# Patient Record
Sex: Female | Born: 1975 | Race: Black or African American | Hispanic: No | Marital: Married | Smoking: Never smoker
Health system: Southern US, Community
[De-identification: ages and names within clinical notes are randomized; demographics above are authoritative.]

## PROBLEM LIST (undated history)

## (undated) ENCOUNTER — Inpatient Hospital Stay (HOSPITAL_COMMUNITY): Payer: Self-pay

## (undated) DIAGNOSIS — Z789 Other specified health status: Secondary | ICD-10-CM

## (undated) DIAGNOSIS — D573 Sickle-cell trait: Secondary | ICD-10-CM

## (undated) DIAGNOSIS — O09529 Supervision of elderly multigravida, unspecified trimester: Secondary | ICD-10-CM

## (undated) HISTORY — DX: Sickle-cell trait: D57.3

## (undated) HISTORY — PX: NO PAST SURGERIES: SHX2092

## (undated) HISTORY — DX: Supervision of elderly multigravida, unspecified trimester: O09.529

---

## 2009-12-24 ENCOUNTER — Ambulatory Visit (HOSPITAL_COMMUNITY): Admission: RE | Admit: 2009-12-24 | Discharge: 2009-12-24 | Payer: Self-pay | Admitting: Obstetrics & Gynecology

## 2010-01-06 ENCOUNTER — Ambulatory Visit (HOSPITAL_COMMUNITY): Admission: RE | Admit: 2010-01-06 | Discharge: 2010-01-06 | Payer: Self-pay | Admitting: Obstetrics & Gynecology

## 2010-01-28 ENCOUNTER — Ambulatory Visit (HOSPITAL_COMMUNITY): Admission: RE | Admit: 2010-01-28 | Discharge: 2010-01-28 | Payer: Self-pay | Admitting: Obstetrics & Gynecology

## 2010-03-04 ENCOUNTER — Ambulatory Visit (HOSPITAL_COMMUNITY)
Admission: RE | Admit: 2010-03-04 | Discharge: 2010-03-04 | Payer: Self-pay | Source: Home / Self Care | Admitting: Obstetrics & Gynecology

## 2010-04-22 ENCOUNTER — Ambulatory Visit (HOSPITAL_COMMUNITY)
Admission: RE | Admit: 2010-04-22 | Discharge: 2010-04-22 | Payer: Self-pay | Source: Home / Self Care | Attending: Obstetrics and Gynecology | Admitting: Obstetrics and Gynecology

## 2010-05-02 ENCOUNTER — Other Ambulatory Visit (HOSPITAL_COMMUNITY): Payer: Self-pay | Admitting: Obstetrics and Gynecology

## 2010-05-02 DIAGNOSIS — O2690 Pregnancy related conditions, unspecified, unspecified trimester: Secondary | ICD-10-CM

## 2010-05-22 ENCOUNTER — Ambulatory Visit (HOSPITAL_COMMUNITY): Payer: Self-pay

## 2010-05-26 ENCOUNTER — Other Ambulatory Visit (HOSPITAL_COMMUNITY): Payer: Self-pay | Admitting: Obstetrics and Gynecology

## 2010-05-26 ENCOUNTER — Ambulatory Visit (HOSPITAL_COMMUNITY)
Admission: RE | Admit: 2010-05-26 | Discharge: 2010-05-26 | Disposition: A | Discharge status: Home / Self Care | Payer: BC Managed Care – PPO | Source: Ambulatory Visit | Attending: Obstetrics and Gynecology | Admitting: Obstetrics and Gynecology

## 2010-05-26 ENCOUNTER — Encounter (HOSPITAL_COMMUNITY): Payer: Self-pay

## 2010-05-26 DIAGNOSIS — E669 Obesity, unspecified: Secondary | ICD-10-CM | POA: Insufficient documentation

## 2010-05-26 DIAGNOSIS — O3510X0 Maternal care for (suspected) chromosomal abnormality in fetus, unspecified, not applicable or unspecified: Secondary | ICD-10-CM | POA: Insufficient documentation

## 2010-05-26 DIAGNOSIS — O2690 Pregnancy related conditions, unspecified, unspecified trimester: Secondary | ICD-10-CM

## 2010-05-26 DIAGNOSIS — O351XX0 Maternal care for (suspected) chromosomal abnormality in fetus, not applicable or unspecified: Secondary | ICD-10-CM | POA: Insufficient documentation

## 2010-05-26 DIAGNOSIS — O99210 Obesity complicating pregnancy, unspecified trimester: Secondary | ICD-10-CM | POA: Insufficient documentation

## 2010-05-26 DIAGNOSIS — O09299 Supervision of pregnancy with other poor reproductive or obstetric history, unspecified trimester: Secondary | ICD-10-CM | POA: Insufficient documentation

## 2010-06-12 ENCOUNTER — Observation Stay (HOSPITAL_COMMUNITY)
Admission: RE | Admit: 2010-06-12 | Discharge: 2010-06-12 | Disposition: A | Discharge status: Home / Self Care | Payer: BC Managed Care – PPO | Source: Ambulatory Visit | Attending: Obstetrics & Gynecology | Admitting: Obstetrics & Gynecology

## 2010-06-12 DIAGNOSIS — O99891 Other specified diseases and conditions complicating pregnancy: Principal | ICD-10-CM | POA: Insufficient documentation

## 2010-07-01 ENCOUNTER — Inpatient Hospital Stay (HOSPITAL_COMMUNITY)
Admission: AD | Admit: 2010-07-01 | Discharge: 2010-07-03 | DRG: 373 | Disposition: A | Discharge status: Home / Self Care | Payer: BC Managed Care – PPO | Source: Ambulatory Visit | Attending: Obstetrics & Gynecology | Admitting: Obstetrics & Gynecology

## 2010-07-01 ENCOUNTER — Inpatient Hospital Stay (HOSPITAL_COMMUNITY): Admission: AD | Admit: 2010-07-01 | Payer: Self-pay | Source: Home / Self Care | Admitting: Obstetrics & Gynecology

## 2010-07-01 DIAGNOSIS — O09299 Supervision of pregnancy with other poor reproductive or obstetric history, unspecified trimester: Secondary | ICD-10-CM

## 2010-07-01 DIAGNOSIS — O09529 Supervision of elderly multigravida, unspecified trimester: Secondary | ICD-10-CM | POA: Diagnosis present

## 2010-07-01 DIAGNOSIS — E669 Obesity, unspecified: Secondary | ICD-10-CM | POA: Diagnosis present

## 2010-07-01 LAB — CBC
HCT: 33.2 % — ABNORMAL LOW (ref 36.0–46.0)
Hemoglobin: 10.7 g/dL — ABNORMAL LOW (ref 12.0–15.0)
MCH: 28.2 pg (ref 26.0–34.0)
MCV: 87.6 fL (ref 78.0–100.0)
RBC: 3.79 MIL/uL — ABNORMAL LOW (ref 3.87–5.11)

## 2010-07-02 ENCOUNTER — Other Ambulatory Visit: Payer: Self-pay | Admitting: Obstetrics & Gynecology

## 2010-07-02 LAB — CBC
HCT: 31.1 % — ABNORMAL LOW (ref 36.0–46.0)
Hemoglobin: 10.1 g/dL — ABNORMAL LOW (ref 12.0–15.0)
MCH: 28.5 pg (ref 26.0–34.0)
MCHC: 32.5 g/dL (ref 30.0–36.0)

## 2010-08-03 ENCOUNTER — Other Ambulatory Visit: Payer: Self-pay | Admitting: Obstetrics & Gynecology

## 2011-01-26 ENCOUNTER — Encounter (HOSPITAL_COMMUNITY): Payer: Self-pay | Admitting: *Deleted

## 2013-04-27 ENCOUNTER — Other Ambulatory Visit: Payer: Self-pay | Admitting: Obstetrics and Gynecology

## 2013-04-27 LAB — OB RESULTS CONSOLE ABO/RH: RH Type: POSITIVE

## 2013-04-27 LAB — OB RESULTS CONSOLE HEPATITIS B SURFACE ANTIGEN: Hepatitis B Surface Ag: NEGATIVE

## 2013-04-27 LAB — OB RESULTS CONSOLE HIV ANTIBODY (ROUTINE TESTING): HIV: NONREACTIVE

## 2013-04-27 LAB — OB RESULTS CONSOLE RUBELLA ANTIBODY, IGM: Rubella: IMMUNE

## 2013-04-27 LAB — OB RESULTS CONSOLE ANTIBODY SCREEN: Antibody Screen: NEGATIVE

## 2013-04-27 LAB — OB RESULTS CONSOLE RPR: RPR: NONREACTIVE

## 2013-05-09 ENCOUNTER — Ambulatory Visit (HOSPITAL_COMMUNITY): Payer: BC Managed Care – PPO

## 2013-10-17 ENCOUNTER — Encounter (HOSPITAL_COMMUNITY): Payer: Self-pay

## 2013-10-17 ENCOUNTER — Inpatient Hospital Stay (HOSPITAL_COMMUNITY)
Admission: AD | Admit: 2013-10-17 | Discharge: 2013-10-17 | Disposition: A | Discharge status: Home / Self Care | Payer: BC Managed Care – PPO | Source: Ambulatory Visit | Attending: Obstetrics and Gynecology | Admitting: Obstetrics and Gynecology

## 2013-10-17 DIAGNOSIS — Z3689 Encounter for other specified antenatal screening: Secondary | ICD-10-CM

## 2013-10-17 DIAGNOSIS — O3090 Multiple gestation, unspecified, unspecified trimester: Secondary | ICD-10-CM

## 2013-10-17 DIAGNOSIS — O368131 Decreased fetal movements, third trimester, fetus 1: Secondary | ICD-10-CM

## 2013-10-17 DIAGNOSIS — O368190 Decreased fetal movements, unspecified trimester, not applicable or unspecified: Secondary | ICD-10-CM | POA: Insufficient documentation

## 2013-10-17 HISTORY — DX: Other specified health status: Z78.9

## 2013-10-17 NOTE — Discharge Instructions (Signed)
Fetal Movement Counts Patient Name: __________________________________________________ Patient Due Date: ____________________ Performing a fetal movement count is highly recommended in high-risk pregnancies, but it is good for every pregnant woman to do. Your caregiver may ask you to start counting fetal movements at 28 weeks of the pregnancy. Fetal movements often increase:  After eating a full meal.  After physical activity.  After eating or drinking something sweet or cold.  At rest. Pay attention to when you feel the baby is most active. This will help you notice a pattern of your baby's sleep and wake cycles and what factors contribute to an increase in fetal movement. It is important to perform a fetal movement count at the same time each day when your baby is normally most active.  HOW TO COUNT FETAL MOVEMENTS 1. Find a quiet and comfortable area to sit or lie down on your left side. Lying on your left side provides the best blood and oxygen circulation to your baby. 2. Write down the day and time on a sheet of paper or in a journal. 3. Start counting kicks, flutters, swishes, rolls, or jabs in a 2 hour period. You should feel at least 10 movements within 2 hours. 4. If you do not feel 10 movements in 2 hours, wait 2-3 hours and count again. Look for a change in the pattern or not enough counts in 2 hours. SEEK MEDICAL CARE IF:  You feel less than 10 counts in 2 hours, tried twice.  There is no movement in over an hour.  The pattern is changing or taking longer each day to reach 10 counts in 2 hours.  You feel the baby is not moving as he or she usually does. Date: ____________ Movements: ____________ Start time: ____________ Kathy Daugherty time: ____________  Date: ____________ Movements: ____________ Start time: ____________ Kathy Daugherty time: ____________ Date: ____________ Movements: ____________ Start time: ____________ Kathy Daugherty time: ____________ Date: ____________ Movements: ____________  Start time: ____________ Kathy Daugherty time: ____________ Date: ____________ Movements: ____________ Start time: ____________ Kathy Daugherty time: ____________ Date: ____________ Movements: ____________ Start time: ____________ Kathy Daugherty time: ____________ Date: ____________ Movements: ____________ Start time: ____________ Kathy Daugherty time: ____________ Date: ____________ Movements: ____________ Start time: ____________ Kathy Daugherty time: ____________  Date: ____________ Movements: ____________ Start time: ____________ Kathy Daugherty time: ____________ Date: ____________ Movements: ____________ Start time: ____________ Kathy Daugherty time: ____________ Date: ____________ Movements: ____________ Start time: ____________ Kathy Daugherty time: ____________ Date: ____________ Movements: ____________ Start time: ____________ Kathy Daugherty time: ____________ Date: ____________ Movements: ____________ Start time: ____________ Kathy Daugherty time: ____________ Date: ____________ Movements: ____________ Start time: ____________ Kathy Daugherty time: ____________ Date: ____________ Movements: ____________ Start time: ____________ Kathy Daugherty time: ____________  Date: ____________ Movements: ____________ Start time: ____________ Kathy Daugherty time: ____________ Document Released: 04/28/2006 Document Revised: 03/15/2012 Document Reviewed: 01/24/2012 ExitCare Patient Information 2015 [employer]. This information is not intended to replace advice given to you by your health care provider. Make sure you discuss any questions you have with your health care provider.

## 2013-10-17 NOTE — MAU Provider Note (Signed)
Chief Complaint:  Decreased Fetal Movement   First Provider Initiated Contact with Patient 10/17/13 [phone]      HPI: Kathy Daugherty is a 38 y.o. [id number] at 34w3dwho presents to maternity admissions reporting she has felt decreased fetal movement since 10 pm.  She is often up at night and usually feels lots of baby movement but was only feeling 1-2 movements/hour for the last few hours. She has hx of stillbirth at term in 2003 with baby dx with Down Syndrome with normal subsequent pregnancy and delivery in 2012 of her son.  She is feeling good fetal movement in the MAU, denies regular contractions, LOF, vaginal bleeding, vaginal itching/burning, urinary symptoms, h/a, dizziness, n/v, or fever/chills.     Past Medical History: Past Medical History  Diagnosis Date  . Medical history non-contributory     Past obstetric history: OB History  Gravida Para Term Preterm AB SAB TAB Ectopic Multiple Living  5 3 3  1 1    2     # Outcome Date GA Lbr Len/2nd Weight Sex Delivery Anes PTL Lv  5 CUR           4 TRM 07/01/10 465w0d3.629 kg (8 lb) M SVD EPI  Y  3 TRM 07/25/01   2.523 kg (5 lb 9 oz) F SVD None  SB     Comments: downs syndrome  2 TRM 02/17/95   3.544 kg (7 lb 13 oz) M SVD EPI  Y  1 SAB 1993              Past Surgical History: Past Surgical History  Procedure Laterality Date  . No past surgeries      Family History: No family history on file.  Social History: History  Substance Use Topics  . Smoking status: Never Smoker   . Smokeless tobacco: Never Used  . Alcohol Use: No    Allergies: No Known Allergies  Meds:  Prescriptions prior to admission  Medication Sig Dispense Refill  . Prenatal Vit-Fe Fumarate-FA (PRENATAL MULTIVITAMIN) TABS tablet Take 1 tablet by mouth daily at 12 noon.        ROS: Pertinent findings in history of present illness.  Physical Exam  Blood pressure 113/69, pulse 84, temperature 98.9 F (37.2 C), temperature source Oral, resp. rate 18, height  5' 7.5" (1.715 m), weight 138.347 kg (305 lb), not currently breastfeeding. GENERAL: Well-developed, well-nourished female in no acute distress.  HEENT: normocephalic HEART: normal rate RESP: normal effort ABDOMEN: Soft, non-tender, gravid appropriate for gestational age EXTREMITIES: Nontender, no edema NEURO: alert and oriented     FHT:  Baseline 140, moderate variability, accelerations present, no decelerations Contractions: None on toco or to palpatoin    Assessment: 1. Decreased fetal movement, third trimester, fetus 1   2. NST (non-stress test) reactive     Plan: Consult Dr AnOuida Sillsischarge home Reviewed reactive NST with pt, reassurance provided Labor precautions and fetal kick counts  Follow-up Information   Follow up with [facility](As scheduled)    Contact information:   [address] [id number] [phone]    Follow up with [facility](As needed for emergencies)    Contact information:   [phone]     Medication List         prenatal multivitamin Tabs tablet  Take 1 tablet by mouth  daily at 12 noon.        Fatima Blank Certified Nurse-Midwife 10/17/2013 3:47 AM

## 2013-10-17 NOTE — MAU Note (Signed)
Decreased movement since 10 pm. States doesn't feel baby move unless she messes with her stomach, then only feels 1 or 2 movements. Denies LOF/vaginal bleeding/abdominal trauma/contractions.

## 2013-10-19 LAB — OB RESULTS CONSOLE GBS: STREP GROUP B AG: NEGATIVE

## 2013-11-02 ENCOUNTER — Telehealth (HOSPITAL_COMMUNITY): Payer: Self-pay | Admitting: *Deleted

## 2013-11-02 ENCOUNTER — Encounter (HOSPITAL_COMMUNITY): Payer: Self-pay | Admitting: *Deleted

## 2013-11-02 NOTE — Telephone Encounter (Signed)
Preadmission screen  

## 2013-11-05 ENCOUNTER — Inpatient Hospital Stay (HOSPITAL_COMMUNITY)
Admission: RE | Admit: 2013-11-05 | Discharge: 2013-11-05 | DRG: 782 | Disposition: A | Discharge status: Home / Self Care | Payer: BC Managed Care – PPO | Source: Ambulatory Visit | Attending: Obstetrics and Gynecology | Admitting: Obstetrics and Gynecology

## 2013-11-05 DIAGNOSIS — Z5309 Procedure and treatment not carried out because of other contraindication: Secondary | ICD-10-CM

## 2013-11-05 DIAGNOSIS — O321XX0 Maternal care for breech presentation, not applicable or unspecified: Secondary | ICD-10-CM | POA: Diagnosis not present

## 2013-11-05 MED ORDER — TERBUTALINE SULFATE 1 MG/ML IJ SOLN
0.2500 mg | Freq: Once | INTRAMUSCULAR | Status: DC
  Filled 2013-11-05: qty 1

## 2013-11-05 MED ORDER — LACTATED RINGERS IV SOLN
INTRAVENOUS | Status: DC
  Administered 2013-11-05: 08:00:00 via INTRAVENOUS

## 2013-11-09 ENCOUNTER — Telehealth (HOSPITAL_COMMUNITY): Payer: Self-pay | Admitting: *Deleted

## 2013-11-09 NOTE — Telephone Encounter (Signed)
Preadmission screen  

## 2013-11-10 ENCOUNTER — Inpatient Hospital Stay (HOSPITAL_COMMUNITY)
Admission: RE | Admit: 2013-11-10 | Discharge: 2013-11-13 | DRG: 765 | Disposition: A | Discharge status: Home / Self Care | Payer: BC Managed Care – PPO | Source: Ambulatory Visit | Attending: Obstetrics and Gynecology | Admitting: Obstetrics and Gynecology

## 2013-11-10 ENCOUNTER — Encounter (HOSPITAL_COMMUNITY): Payer: Self-pay

## 2013-11-10 ENCOUNTER — Inpatient Hospital Stay (HOSPITAL_COMMUNITY): Payer: BC Managed Care – PPO

## 2013-11-10 DIAGNOSIS — Z9889 Other specified postprocedural states: Secondary | ICD-10-CM

## 2013-11-10 DIAGNOSIS — Z9851 Tubal ligation status: Secondary | ICD-10-CM

## 2013-11-10 DIAGNOSIS — O322XX0 Maternal care for transverse and oblique lie, not applicable or unspecified: Secondary | ICD-10-CM | POA: Diagnosis not present

## 2013-11-10 DIAGNOSIS — O99214 Obesity complicating childbirth: Secondary | ICD-10-CM

## 2013-11-10 DIAGNOSIS — O3090 Multiple gestation, unspecified, unspecified trimester: Secondary | ICD-10-CM | POA: Diagnosis not present

## 2013-11-10 DIAGNOSIS — O322XX1 Maternal care for transverse and oblique lie, fetus 1: Secondary | ICD-10-CM

## 2013-11-10 DIAGNOSIS — D573 Sickle-cell trait: Secondary | ICD-10-CM | POA: Diagnosis present

## 2013-11-10 DIAGNOSIS — E669 Obesity, unspecified: Secondary | ICD-10-CM | POA: Diagnosis present

## 2013-11-10 DIAGNOSIS — Z6841 Body Mass Index (BMI) 40.0 and over, adult: Secondary | ICD-10-CM

## 2013-11-10 DIAGNOSIS — O09529 Supervision of elderly multigravida, unspecified trimester: Secondary | ICD-10-CM | POA: Diagnosis present

## 2013-11-10 DIAGNOSIS — O9902 Anemia complicating childbirth: Secondary | ICD-10-CM | POA: Diagnosis present

## 2013-11-10 DIAGNOSIS — Z302 Encounter for sterilization: Secondary | ICD-10-CM

## 2013-11-10 LAB — CBC
HEMATOCRIT: 30.7 % — AB (ref 36.0–46.0)
HEMOGLOBIN: 10.2 g/dL — AB (ref 12.0–15.0)
MCH: 28.4 pg (ref 26.0–34.0)
MCHC: 33.2 g/dL (ref 30.0–36.0)
MCV: 85.5 fL (ref 78.0–100.0)
Platelets: 133 10*3/uL — ABNORMAL LOW (ref 150–400)
RBC: 3.59 MIL/uL — ABNORMAL LOW (ref 3.87–5.11)
RDW: 15.8 % — ABNORMAL HIGH (ref 11.5–15.5)
WBC: 8.5 10*3/uL (ref 4.0–10.5)

## 2013-11-10 LAB — TYPE AND SCREEN
ABO/RH(D): AB POS
Antibody Screen: NEGATIVE

## 2013-11-10 MED ORDER — OXYTOCIN BOLUS FROM INFUSION: 500.0000 mL | INTRAVENOUS | Status: DC

## 2013-11-10 MED ORDER — FLEET ENEMA 7-19 GM/118ML RE ENEM: 1.0000 | ENEMA | RECTAL | Status: DC | PRN

## 2013-11-10 MED ORDER — IBUPROFEN 600 MG PO TABS: 600.0000 mg | ORAL_TABLET | Freq: Four times a day (QID) | ORAL | Status: DC | PRN

## 2013-11-10 MED ORDER — LIDOCAINE HCL (PF) 1 % IJ SOLN: 30.0000 mL | INTRAMUSCULAR | Status: DC | PRN

## 2013-11-10 MED ORDER — ACETAMINOPHEN 325 MG PO TABS: 650.0000 mg | ORAL_TABLET | ORAL | Status: DC | PRN

## 2013-11-10 MED ORDER — OXYTOCIN 40 UNITS IN LACTATED RINGERS INFUSION - SIMPLE MED: 62.5000 mL/h | INTRAVENOUS | Status: DC

## 2013-11-10 MED ORDER — CITRIC ACID-SODIUM CITRATE 334-500 MG/5ML PO SOLN
30.0000 mL | ORAL | Status: DC | PRN
  Administered 2013-11-11: 30 mL via ORAL
  Filled 2013-11-10: qty 15

## 2013-11-10 MED ORDER — OXYCODONE-ACETAMINOPHEN 5-325 MG PO TABS: 1.0000 | ORAL_TABLET | ORAL | Status: DC | PRN

## 2013-11-10 MED ORDER — LACTATED RINGERS IV SOLN: 500.0000 mL | INTRAVENOUS | Status: DC | PRN

## 2013-11-10 MED ORDER — LACTATED RINGERS IV SOLN
INTRAVENOUS | Status: DC
  Administered 2013-11-10 – 2013-11-11 (×3): via INTRAVENOUS

## 2013-11-10 NOTE — Progress Notes (Signed)
Called Dr.  Philis Pique-  Notified of FHR, Uc's too close for  Cytotec, recent Exam- 1/thick/hich- ? Presenting part.  MD request NP MAU verifiy presenting part with ultrasound and call back.

## 2013-11-10 NOTE — Progress Notes (Signed)
Notified Dr. Philis Pique of Bedside US- Transverse Lie. MD ordered Complete US- AFI, BPP, Presentation. Call MD with results.

## 2013-11-10 NOTE — Progress Notes (Signed)
Patient ID: Kathy Daugherty, female   DOB: October 10, 1975, 38 y.o.   MRN: [id number] This is a 38 y.o. female at 30w6dwho I was asked to perform an ultrasound on to verify presentation.  She has previously had an unstable lie, recently scheduled for version and found to be vertex.  RN's are unable to feel presenting part tonight.   Bedside UKorea(Limited OB) performed.  Bladder visible. Fetal back anterior and in a transverse lie with the fetal vertex found in the Right lower quadrant.   Fetal heart rate visible, 150s.   RN will report to Dr HPhilis Pique   MSeabron Spates CNM

## 2013-11-10 NOTE — Progress Notes (Signed)
Called Dr. Philis Pique- notified of U/S results- with Transverse lie, Funic presentation, and BPP 8/8. MD orders to monitor pt through the night- MD will call OR to schedule c/s for tomorrow.

## 2013-11-11 ENCOUNTER — Encounter (HOSPITAL_COMMUNITY)
Admission: RE | Disposition: A | Discharge status: Home / Self Care | Payer: Self-pay | Source: Ambulatory Visit | Attending: Obstetrics and Gynecology

## 2013-11-11 ENCOUNTER — Inpatient Hospital Stay (HOSPITAL_COMMUNITY): Payer: BC Managed Care – PPO | Admitting: Anesthesiology

## 2013-11-11 ENCOUNTER — Encounter (HOSPITAL_COMMUNITY): Payer: BC Managed Care – PPO | Admitting: Anesthesiology

## 2013-11-11 ENCOUNTER — Encounter (HOSPITAL_COMMUNITY): Payer: Self-pay

## 2013-11-11 DIAGNOSIS — Z9889 Other specified postprocedural states: Secondary | ICD-10-CM

## 2013-11-11 LAB — RPR

## 2013-11-11 LAB — ABO/RH: ABO/RH(D): AB POS

## 2013-11-11 SURGERY — Surgical Case
Anesthesia: Spinal

## 2013-11-11 MED ORDER — PRENATAL MULTIVITAMIN CH
1.0000 | ORAL_TABLET | Freq: Every day | ORAL | Status: DC
  Administered 2013-11-12 – 2013-11-13 (×2): 1 via ORAL
  Filled 2013-11-11 (×2): qty 1

## 2013-11-11 MED ORDER — ONDANSETRON HCL 4 MG/2ML IJ SOLN: 4.0000 mg | INTRAMUSCULAR | Status: DC | PRN

## 2013-11-11 MED ORDER — DIBUCAINE 1 % RE OINT: 1.0000 "application " | TOPICAL_OINTMENT | RECTAL | Status: DC | PRN

## 2013-11-11 MED ORDER — MEPERIDINE HCL 25 MG/ML IJ SOLN: 6.2500 mg | INTRAMUSCULAR | Status: DC | PRN

## 2013-11-11 MED ORDER — PHENYLEPHRINE 8 MG IN D5W 100 ML (0.08MG/ML) PREMIX OPTIME
INJECTION | INTRAVENOUS | Status: AC
  Filled 2013-11-11: qty 100

## 2013-11-11 MED ORDER — IBUPROFEN 600 MG PO TABS
600.0000 mg | ORAL_TABLET | Freq: Four times a day (QID) | ORAL | Status: DC
Start: 2013-11-11 — End: 2013-11-13
  Administered 2013-11-12 – 2013-11-13 (×5): 600 mg via ORAL
  Filled 2013-11-11 (×5): qty 1

## 2013-11-11 MED ORDER — MENTHOL 3 MG MT LOZG: 1.0000 | LOZENGE | OROMUCOSAL | Status: DC | PRN

## 2013-11-11 MED ORDER — MORPHINE SULFATE (PF) 0.5 MG/ML IJ SOLN
INTRAMUSCULAR | Status: DC | PRN
  Administered 2013-11-11: .2 mg via INTRATHECAL

## 2013-11-11 MED ORDER — SIMETHICONE 80 MG PO CHEW
80.0000 mg | CHEWABLE_TABLET | Freq: Three times a day (TID) | ORAL | Status: DC
  Administered 2013-11-12 – 2013-11-13 (×4): 80 mg via ORAL
  Filled 2013-11-11 (×3): qty 1

## 2013-11-11 MED ORDER — BISACODYL 10 MG RE SUPP: 10.0000 mg | Freq: Every day | RECTAL | Status: DC | PRN

## 2013-11-11 MED ORDER — NALBUPHINE HCL 10 MG/ML IJ SOLN: 5.0000 mg | INTRAMUSCULAR | Status: DC | PRN

## 2013-11-11 MED ORDER — SIMETHICONE 80 MG PO CHEW: 80.0000 mg | CHEWABLE_TABLET | ORAL | Status: DC | PRN

## 2013-11-11 MED ORDER — DIPHENHYDRAMINE HCL 25 MG PO CAPS: 25.0000 mg | ORAL_CAPSULE | ORAL | Status: DC | PRN

## 2013-11-11 MED ORDER — KETOROLAC TROMETHAMINE 30 MG/ML IJ SOLN
30.0000 mg | Freq: Four times a day (QID) | INTRAMUSCULAR | Status: AC | PRN
  Administered 2013-11-11 – 2013-11-12 (×3): 30 mg via INTRAVENOUS
  Filled 2013-11-11 (×2): qty 1

## 2013-11-11 MED ORDER — SODIUM CHLORIDE 0.9 % IR SOLN
Status: DC | PRN
  Administered 2013-11-11: 1000 mL

## 2013-11-11 MED ORDER — ONDANSETRON HCL 4 MG/2ML IJ SOLN
INTRAMUSCULAR | Status: AC
  Filled 2013-11-11: qty 2

## 2013-11-11 MED ORDER — FERROUS SULFATE 325 (65 FE) MG PO TABS
325.0000 mg | ORAL_TABLET | Freq: Two times a day (BID) | ORAL | Status: DC
  Administered 2013-11-11 – 2013-11-13 (×4): 325 mg via ORAL
  Filled 2013-11-11 (×4): qty 1

## 2013-11-11 MED ORDER — NALOXONE HCL 1 MG/ML IJ SOLN: 1.0000 ug/kg/h | INTRAVENOUS | Status: DC | PRN

## 2013-11-11 MED ORDER — OXYTOCIN 10 UNIT/ML IJ SOLN
40.0000 [IU] | INTRAVENOUS | Status: DC | PRN
  Administered 2013-11-11: 40 [IU] via INTRAVENOUS

## 2013-11-11 MED ORDER — NALBUPHINE HCL 10 MG/ML IJ SOLN
5.0000 mg | INTRAMUSCULAR | Status: DC | PRN
Start: 2013-11-11 — End: 2013-11-13

## 2013-11-11 MED ORDER — METOCLOPRAMIDE HCL 5 MG/ML IJ SOLN: 10.0000 mg | Freq: Three times a day (TID) | INTRAMUSCULAR | Status: DC | PRN

## 2013-11-11 MED ORDER — BUPIVACAINE IN DEXTROSE 0.75-8.25 % IT SOLN
INTRATHECAL | Status: DC | PRN
  Administered 2013-11-11: 1.6 mL via INTRATHECAL

## 2013-11-11 MED ORDER — HYDROMORPHONE HCL PF 1 MG/ML IJ SOLN
INTRAMUSCULAR | Status: AC
  Filled 2013-11-11: qty 1

## 2013-11-11 MED ORDER — OXYTOCIN 40 UNITS IN LACTATED RINGERS INFUSION - SIMPLE MED: 62.5000 mL/h | INTRAVENOUS | Status: AC

## 2013-11-11 MED ORDER — KETOROLAC TROMETHAMINE 30 MG/ML IJ SOLN: 15.0000 mg | Freq: Once | INTRAMUSCULAR | Status: DC | PRN

## 2013-11-11 MED ORDER — DIPHENHYDRAMINE HCL 25 MG PO CAPS: 25.0000 mg | ORAL_CAPSULE | Freq: Four times a day (QID) | ORAL | Status: DC | PRN

## 2013-11-11 MED ORDER — ONDANSETRON HCL 4 MG PO TABS: 4.0000 mg | ORAL_TABLET | ORAL | Status: DC | PRN

## 2013-11-11 MED ORDER — LACTATED RINGERS IV SOLN
INTRAVENOUS | Status: DC | PRN
  Administered 2013-11-11: 09:00:00 via INTRAVENOUS

## 2013-11-11 MED ORDER — SCOPOLAMINE 1 MG/3DAYS TD PT72
1.0000 | MEDICATED_PATCH | Freq: Once | TRANSDERMAL | Status: DC
  Administered 2013-11-11: 1.5 mg via TRANSDERMAL

## 2013-11-11 MED ORDER — SIMETHICONE 80 MG PO CHEW
80.0000 mg | CHEWABLE_TABLET | ORAL | Status: DC
  Administered 2013-11-12 (×2): 80 mg via ORAL
  Filled 2013-11-11 (×2): qty 1

## 2013-11-11 MED ORDER — FENTANYL CITRATE 0.05 MG/ML IJ SOLN
INTRAMUSCULAR | Status: DC | PRN
  Administered 2013-11-11: 12.5 ug via INTRATHECAL
  Administered 2013-11-11: 37.5 ug via INTRAVENOUS

## 2013-11-11 MED ORDER — FLEET ENEMA 7-19 GM/118ML RE ENEM: 1.0000 | ENEMA | Freq: Every day | RECTAL | Status: DC | PRN

## 2013-11-11 MED ORDER — ONDANSETRON HCL 4 MG/2ML IJ SOLN
INTRAMUSCULAR | Status: DC | PRN
  Administered 2013-11-11: 4 mg via INTRAVENOUS

## 2013-11-11 MED ORDER — KETOROLAC TROMETHAMINE 30 MG/ML IJ SOLN: 30.0000 mg | Freq: Four times a day (QID) | INTRAMUSCULAR | Status: AC | PRN

## 2013-11-11 MED ORDER — OXYTOCIN 10 UNIT/ML IJ SOLN
INTRAMUSCULAR | Status: AC
  Filled 2013-11-11: qty 4

## 2013-11-11 MED ORDER — SCOPOLAMINE 1 MG/3DAYS TD PT72
MEDICATED_PATCH | TRANSDERMAL | Status: AC
  Administered 2013-11-11: 1.5 mg via TRANSDERMAL
  Filled 2013-11-11: qty 1

## 2013-11-11 MED ORDER — MEASLES, MUMPS & RUBELLA VAC ~~LOC~~ INJ: 0.5000 mL | INJECTION | Freq: Once | SUBCUTANEOUS | Status: DC

## 2013-11-11 MED ORDER — TETANUS-DIPHTH-ACELL PERTUSSIS 5-2.5-18.5 LF-MCG/0.5 IM SUSP
0.5000 mL | Freq: Once | INTRAMUSCULAR | Status: AC
  Administered 2013-11-11: 0.5 mL via INTRAMUSCULAR
  Filled 2013-11-11: qty 0.5

## 2013-11-11 MED ORDER — NALOXONE HCL 0.4 MG/ML IJ SOLN: 0.4000 mg | INTRAMUSCULAR | Status: DC | PRN

## 2013-11-11 MED ORDER — DIPHENHYDRAMINE HCL 50 MG/ML IJ SOLN
12.5000 mg | INTRAMUSCULAR | Status: DC | PRN
  Administered 2013-11-11: 12.5 mg via INTRAVENOUS
  Filled 2013-11-11: qty 1

## 2013-11-11 MED ORDER — ONDANSETRON HCL 4 MG/2ML IJ SOLN: 4.0000 mg | Freq: Three times a day (TID) | INTRAMUSCULAR | Status: DC | PRN

## 2013-11-11 MED ORDER — OXYCODONE-ACETAMINOPHEN 5-325 MG PO TABS
1.0000 | ORAL_TABLET | ORAL | Status: DC | PRN
  Administered 2013-11-12 – 2013-11-13 (×3): 1 via ORAL
  Filled 2013-11-11 (×4): qty 1

## 2013-11-11 MED ORDER — FENTANYL CITRATE 0.05 MG/ML IJ SOLN
INTRAMUSCULAR | Status: AC
  Filled 2013-11-11: qty 2

## 2013-11-11 MED ORDER — SODIUM CHLORIDE 0.9 % IJ SOLN: 3.0000 mL | INTRAMUSCULAR | Status: DC | PRN

## 2013-11-11 MED ORDER — DIPHENHYDRAMINE HCL 50 MG/ML IJ SOLN: 25.0000 mg | INTRAMUSCULAR | Status: DC | PRN

## 2013-11-11 MED ORDER — LANOLIN HYDROUS EX OINT: 1.0000 "application " | TOPICAL_OINTMENT | CUTANEOUS | Status: DC | PRN

## 2013-11-11 MED ORDER — ZOLPIDEM TARTRATE 5 MG PO TABS: 5.0000 mg | ORAL_TABLET | Freq: Every evening | ORAL | Status: DC | PRN

## 2013-11-11 MED ORDER — HYDROMORPHONE HCL PF 1 MG/ML IJ SOLN
0.2500 mg | INTRAMUSCULAR | Status: DC | PRN
  Administered 2013-11-11: 0.5 mg via INTRAVENOUS

## 2013-11-11 MED ORDER — MORPHINE SULFATE 0.5 MG/ML IJ SOLN
INTRAMUSCULAR | Status: AC
Start: 2013-11-11 — End: 2013-11-11
  Filled 2013-11-11: qty 10

## 2013-11-11 MED ORDER — LACTATED RINGERS IV SOLN
INTRAVENOUS | Status: DC
  Administered 2013-11-12: 02:00:00 via INTRAVENOUS

## 2013-11-11 MED ORDER — METHYLERGONOVINE MALEATE 0.2 MG/ML IJ SOLN: 0.2000 mg | INTRAMUSCULAR | Status: DC | PRN

## 2013-11-11 MED ORDER — WITCH HAZEL-GLYCERIN EX PADS: 1.0000 "application " | MEDICATED_PAD | CUTANEOUS | Status: DC | PRN

## 2013-11-11 MED ORDER — KETOROLAC TROMETHAMINE 30 MG/ML IJ SOLN
INTRAMUSCULAR | Status: AC
  Administered 2013-11-11: 30 mg via INTRAVENOUS
  Filled 2013-11-11: qty 1

## 2013-11-11 MED ORDER — SENNOSIDES-DOCUSATE SODIUM 8.6-50 MG PO TABS
2.0000 | ORAL_TABLET | ORAL | Status: DC
  Administered 2013-11-12 (×2): 2 via ORAL
  Filled 2013-11-11 (×2): qty 2

## 2013-11-11 MED ORDER — DEXTROSE 5 % IV SOLN
3.0000 g | INTRAVENOUS | Status: DC | PRN
  Administered 2013-11-11: 3 g via INTRAVENOUS

## 2013-11-11 MED ORDER — PHENYLEPHRINE 8 MG IN D5W 100 ML (0.08MG/ML) PREMIX OPTIME
INJECTION | INTRAVENOUS | Status: DC | PRN
  Administered 2013-11-11: 60 ug/min via INTRAVENOUS

## 2013-11-11 MED ORDER — METHYLERGONOVINE MALEATE 0.2 MG PO TABS: 0.2000 mg | ORAL_TABLET | ORAL | Status: DC | PRN

## 2013-11-11 MED ORDER — IBUPROFEN 600 MG PO TABS: 600.0000 mg | ORAL_TABLET | Freq: Four times a day (QID) | ORAL | Status: DC | PRN

## 2013-11-11 SURGICAL SUPPLY — 31 items
BENZOIN TINCTURE PRP APPL 2/3 (GAUZE/BANDAGES/DRESSINGS) ×2 IMPLANT
BLADE SURG 10 STRL SS (BLADE) ×4 IMPLANT
CLIP FILSHIE TUBAL LIGA STRL (Clip) ×4 IMPLANT
CLOTH BEACON ORANGE TIMEOUT ST (SAFETY) ×2 IMPLANT
DRAPE LG THREE QUARTER DISP (DRAPES) ×2 IMPLANT
DRSG OPSITE POSTOP 4X10 (GAUZE/BANDAGES/DRESSINGS) ×2 IMPLANT
DURAPREP 26ML APPLICATOR (WOUND CARE) ×2 IMPLANT
ELECT REM PT RETURN 9FT ADLT (ELECTROSURGICAL) ×2
ELECTRODE REM PT RTRN 9FT ADLT (ELECTROSURGICAL) ×1 IMPLANT
GAUZE SPONGE 4X4 16PLY XRAY LF (GAUZE/BANDAGES/DRESSINGS) ×2 IMPLANT
GLOVE BIO SURGEON STRL SZ7 (GLOVE) ×6 IMPLANT
GOWN STRL REUS W/TWL LRG LVL3 (GOWN DISPOSABLE) ×2 IMPLANT
KIT ABG SYR 3ML LUER SLIP (SYRINGE) IMPLANT
NS IRRIG 1000ML POUR BTL (IV SOLUTION) ×2 IMPLANT
PACK C SECTION WH (CUSTOM PROCEDURE TRAY) ×2 IMPLANT
PAD OB MATERNITY 4.3X12.25 (PERSONAL CARE ITEMS) ×2 IMPLANT
RETRACTOR WND ALEXIS 25 LRG (MISCELLANEOUS) ×1 IMPLANT
RTRCTR WOUND ALEXIS 25CM LRG (MISCELLANEOUS) ×2
SPONGE SURGIFOAM ABS GEL 12-7 (HEMOSTASIS) ×6 IMPLANT
STRIP CLOSURE SKIN 1/2X4 (GAUZE/BANDAGES/DRESSINGS) ×2 IMPLANT
SUT MNCRL 0 VIOLET CTX 36 (SUTURE) ×3 IMPLANT
SUT MONOCRYL 0 CTX 36 (SUTURE) ×3
SUT PDS AB 0 CTX 60 (SUTURE) ×2 IMPLANT
SUT VIC AB 0 CT1 27 (SUTURE) ×2
SUT VIC AB 0 CT1 27XBRD ANBCTR (SUTURE) ×2 IMPLANT
SUT VIC AB 2-0 CT1 27 (SUTURE) ×1
SUT VIC AB 2-0 CT1 TAPERPNT 27 (SUTURE) ×1 IMPLANT
SUT VIC AB 4-0 KS 27 (SUTURE) ×2 IMPLANT
SUT VICRYL 3 0 RAPIDE (SUTURE) ×2 IMPLANT
TOWEL OR 17X24 6PK STRL BLUE (TOWEL DISPOSABLE) ×2 IMPLANT
TRAY FOLEY CATH 14FR (SET/KITS/TRAYS/PACK) ×2 IMPLANT

## 2013-11-11 NOTE — Anesthesia Procedure Notes (Addendum)
Spinal  Patient location during procedure: OR Start time: 11/11/2013 8:39 AM End time: 11/11/2013 8:41 AM Staffing Anesthesiologist: Lyn Hollingshead Performed by: anesthesiologist  Preanesthetic Checklist Completed: patient identified, surgical consent, pre-op evaluation, timeout performed, IV checked, risks and benefits discussed and monitors and equipment checked Spinal Block Patient position: sitting Prep: site prepped and draped and DuraPrep Patient monitoring: heart rate, cardiac monitor, continuous pulse ox and blood pressure Approach: midline Location: L3-4 Injection technique: single-shot Needle Needle type: Pencan  Needle gauge: 24 G Needle length: 9 cm Needle insertion depth: 8 cm Assessment Sensory level: T4

## 2013-11-11 NOTE — Anesthesia Postprocedure Evaluation (Signed)
Anesthesia Post Note  Patient: Kathy Daugherty  Procedure(s) Performed: Procedure(s) (LRB): CESAREAN SECTION (N/A)  Anesthesia type: Spinal  Patient location: PACU  Post pain: Pain level controlled  Post assessment: Post-op Vital signs reviewed  Last Vitals:  Filed Vitals:   11/11/13 1115  BP: 114/59  Pulse: 74  Temp:   Resp: 17    Post vital signs: Reviewed  Level of consciousness: awake  Complications: No apparent anesthesia complications

## 2013-11-11 NOTE — Transfer of Care (Signed)
Immediate Anesthesia Transfer of Care Note  Patient: Kathy Daugherty  Procedure(s) Performed: Procedure(s): CESAREAN SECTION (N/A)  Patient Location: PACU  Anesthesia Type:Spinal  Level of Consciousness: awake and alert   Airway & Oxygen Therapy: Patient Spontanous Breathing  Post-op Assessment: Report given to PACU RN and Post -op Vital signs reviewed and stable  Post vital signs: Reviewed and stable  Complications: No apparent anesthesia complications

## 2013-11-11 NOTE — Anesthesia Preprocedure Evaluation (Signed)
Anesthesia Evaluation  Patient identified by MRN, date of birth, ID band Patient awake    Reviewed: Allergy & Precautions, H&P , NPO status , Patient's Chart, lab work & pertinent test results  Airway Mallampati: II TM Distance: >3 FB Neck ROM: full    Dental no notable dental hx.    Pulmonary neg pulmonary ROS,    Pulmonary exam normal       Cardiovascular negative cardio ROS      Neuro/Psych negative neurological ROS  negative psych ROS   GI/Hepatic negative GI ROS, Neg liver ROS,   Endo/Other  Morbid obesity  Renal/GU negative Renal ROS     Musculoskeletal   Abdominal (+) + obese,   Peds  Hematology negative hematology ROS (+)   Anesthesia Other Findings   Reproductive/Obstetrics (+) Pregnancy                           Anesthesia Physical Anesthesia Plan  ASA: III  Anesthesia Plan: Spinal   Post-op Pain Management:    Induction:   Airway Management Planned:   Additional Equipment:   Intra-op Plan:   Post-operative Plan:   Informed Consent: I have reviewed the patients History and Physical, chart, labs and discussed the procedure including the risks, benefits and alternatives for the proposed anesthesia with the patient or authorized representative who has indicated his/her understanding and acceptance.     Plan Discussed with:   Anesthesia Plan Comments:         Anesthesia Quick Evaluation

## 2013-11-11 NOTE — Op Note (Signed)
11/10/2013 - 11/11/2013  10:08 AM  PATIENT:  Kathy Daugherty  38 y.o. female  PRE-OPERATIVE DIAGNOSIS:  tranverse presentation, funic presentation, desires sterilization  POST-OPERATIVE DIAGNOSIS: same  PROCEDURE:  Procedure(s): CESAREAN SECTION (N/A)  SURGEON:  Surgeon(s) and Role:    * Daria Pastures, MD - Primary   ANESTHESIA:   spinal  EBL:  Total I/O In: 1900 [I.V.:1900] Out: 750 [Urine:150; Blood:600]   SPECIMEN:  No Specimen  DISPOSITION OF SPECIMEN:  N/A  COUNTS:  YES  TOURNIQUET:  * No tourniquets in log *  DICTATION: .Note written in EPIC  PLAN OF CARE: Admit to inpatient   PATIENT DISPOSITION:  PACU - hemodynamically stable.   Delay start of Pharmacological VTE agent (>24hrs) due to surgical blood loss or risk of bleeding: not applicable  Complications:  None. Medications:  Ancef, Pitocin Findings:  Baby female, Apgars 8,9, weight P.   Normal tubes, ovaries and uterus seen.  Baby was skin to skin with mother after birth in the OR.  Reason for Operation:  Pt presented for elective induction last night secondary had variable presentation but was VTX as of Thursday.  US revealed that baby was once again transverse and now had a funic presentation.  Pt admitted O/N for obs and r/o labor.  This am I counseled pt about proceeding with C/S for funic presentation at term, transverse lie.   Technique:  After adequate spinal anesthesia was achieved, the patient was prepped and draped in usual sterile fashion.  A foley catheter was used to drain the bladder.  A pfannanstiel incision was made with the scalpel and carried down to the fascia with the bovie cautery. The fascia was incised in the midline with the scalpel and carried in a transverse curvilinear manner bilaterally.  The fascia was reflected superiorly and inferiorly off the rectus muscles and the muscles split in the midline.  A bowel free portion of the peritoneum was entered bluntly and then extended in a  superior and inferior manner with good visualization of the bowel and bladder.  The Alexis instrument was then placed and the vesico-uterine fascia tented up and incised in a transverse curvilinear manner.  A 2 cm transverse incision was made in the upper portion of the lower uterine segment until the amnion was exposed.   The incision was extended transversely in a blunt manner.  Clear fluid was noted and the baby delivered in the vertex presentation after pushing the head toward the LUS without complication.  The baby was bulb suctioned and the cord was clamped and cut.  The baby was then handed to awaiting Neonatology.  The placenta was then delivered manually and the uterus cleared of all debris.  The uterine incision was then closed with a running lock stitch of 0 monocryl.  An imbricating layer of 0 monocryl was closed as well. Hemostasis of the uterine incision was achieved but the uterine serosa was oozing in several places.  After placing several stitches and using cautery to stop the oozing, it was slowed enough to place some gel foam over the areas.  The bladder was pulled over the area and gelfoam and stitched in place with 3-0 vicryl. The abdomen was cleared with irrigation.  The peritoneum was closed with a running stitch of 2-0 vicryl.  This incorporated the rectus muscles as a separate layer.  The fascia was then closed with a running stitch of 0 vicryl.  The subcutaneous layer was closed with interrupted  stitches of 2-0 plain  gut.  The skin was closed with 4-0 vicryl on a Keith needle and steri-strips.  The patient tolerated the procedure well and was returned to the recovery room in stable condition.  All counts were correct times three.  Kathy Daugherty A

## 2013-11-11 NOTE — Brief Op Note (Signed)
11/10/2013 - 11/11/2013  10:08 AM  PATIENT:  Kathy Daugherty  38 y.o. female  PRE-OPERATIVE DIAGNOSIS:  tranverse presentation, funic presentation, desires sterilization  POST-OPERATIVE DIAGNOSIS: same  PROCEDURE:  Procedure(s): CESAREAN SECTION (N/A)  SURGEON:  Surgeon(s) and Role:    * Daria Pastures, MD - Primary   ANESTHESIA:   spinal  EBL:  Total I/O In: 1900 [I.V.:1900] Out: 750 [Urine:150; Blood:600]   SPECIMEN:  No Specimen  DISPOSITION OF SPECIMEN:  N/A  COUNTS:  YES  TOURNIQUET:  * No tourniquets in log *  DICTATION: .Note written in EPIC  PLAN OF CARE: Admit to inpatient   PATIENT DISPOSITION:  PACU - hemodynamically stable.   Delay start of Pharmacological VTE agent (>24hrs) due to surgical blood loss or risk of bleeding: not applicable  Complications:  None. Medications:  Ancef, Pitocin Findings:  Baby female, Apgars 8,9, weight P.   Normal tubes, ovaries and uterus seen.  Baby was skin to skin with mother after birth in the OR.  Reason for Operation:  Pt presented for elective induction last night secondary had variable presentation but was VTX as of Thursday.  US revealed that baby was once again transverse and now had a funic presentation.  Pt admitted O/N for obs and r/o labor.  This am I counseled pt about proceeding with C/S for funic presentation at term, transverse lie.   Technique:  After adequate spinal anesthesia was achieved, the patient was prepped and draped in usual sterile fashion.  A foley catheter was used to drain the bladder.  A pfannanstiel incision was made with the scalpel and carried down to the fascia with the bovie cautery. The fascia was incised in the midline with the scalpel and carried in a transverse curvilinear manner bilaterally.  The fascia was reflected superiorly and inferiorly off the rectus muscles and the muscles split in the midline.  A bowel free portion of the peritoneum was entered bluntly and then extended in a  superior and inferior manner with good visualization of the bowel and bladder.  The Alexis instrument was then placed and the vesico-uterine fascia tented up and incised in a transverse curvilinear manner.  A 2 cm transverse incision was made in the upper portion of the lower uterine segment until the amnion was exposed.   The incision was extended transversely in a blunt manner.  Clear fluid was noted and the baby delivered in the vertex presentation without complication.  The baby was bulb suctioned and the cord was clamped and cut.  The baby was then handed to awaiting Neonatology.  The placenta was then delivered manually and the uterus cleared of all debris.  The uterine incision was then closed with a running lock stitch of 0 monocryl.  An imbricating layer of 0 monocryl was closed as well. Hemostasis of the uterine incision was achieved but the uterine serosa was oozing in several places.  After placing several stitches and using cautery to stop the oozing, it was slowed enough to place some gel foam over the areas.  The bladder was pulled over the area and gelfoam and stitched in place with 3-0 vicryl. The abdomen was cleared with irrigation.  The peritoneum was closed with a running stitch of 2-0 vicryl.  This incorporated the rectus muscles as a separate layer.  The fascia was then closed with a running stitch of 0 vicryl.  The subcutaneous layer was closed with interrupted  stitches of 2-0 plain gut.  The skin was closed with  4-0 vicryl on a Keith needle and steri-strips.  The patient tolerated the procedure well and was returned to the recovery room in stable condition.  All counts were correct times three.  Delane Wessinger A

## 2013-11-11 NOTE — H&P (Signed)
38 y.o. [redacted]w[redacted]d [id number] in last night for induction at term.  Otherwise has good fetal movement and no bleeding.  UKoreahowever showed that baby changed position again (on Thur was VTX) to transverse back down with funic presentation.  Pt kept o/n for CTXes and r/o labor (at risk for cord prolapse.)  This am I had d/w pt that low AFI (11) and unfavorable cervix makes version extremely unlikely to be persistently successful.  I also d/w pt the risk of cord prolapse with a funic presentation if she ruptures and how dangerous that would be if she was at home.  Pt is unhappy that she will have to have a LTCS for her fourth child but understands and voices desire to proceed with LTCS at term for transverse fetal position with funic presentation.  Past Medical History  Diagnosis Date  . Medical history non-contributory   . AMA (advanced maternal age) multigravida 347+  . Sickle cell trait     Past Surgical History  Procedure Laterality Date  . No past surgeries      OB History  Gravida Para Term Preterm AB SAB TAB Ectopic Multiple Living  5 3 3  1 1    2     # Outcome Date GA Lbr Len/2nd Weight Sex Delivery Anes PTL Lv  5 CUR           4 TRM 07/01/10 461w0d3.629 kg (8 lb) M SVD EPI  Y  3 TRM 07/25/01   2.523 kg (5 lb 9 oz) F SVD None  SB     Comments: downs syndrome  2 TRM 02/17/95   3.544 kg (7 lb 13 oz) M SVD EPI  Y     Comments: downs  1 SAB 1993              History   Social History  . Marital Status: Married    Spouse Name: N/A    Number of Children: N/A  . Years of Education: N/A   Occupational History  . Not on file.   Social History Main Topics  . Smoking status: Never Smoker   . Smokeless tobacco: Never Used  . Alcohol Use: No  . Drug Use: No  . Sexual Activity: Yes    Birth Control/ Protection: None   Other Topics Concern  . Not on file   Social History Narrative  . No narrative on file   Review of patient's allergies indicates no known allergies.     Prenatal Transfer Tool  Maternal Diabetes: No Genetic Screening: Normal-AMA, NIPT 4630X Maternal Ultrasounds/Referrals: Normal Fetal Ultrasounds or other Referrals:  None Maternal Substance Abuse:  No Significant Maternal Medications:  None Significant Maternal Lab Results: None  Other PNC: uncomplicated.    Filed Vitals:   11/11/13 0646  BP: 107/63  Pulse: 84  Temp: 98.5 F (36.9 C)  Resp: 18     Lungs/Cor:  NAD Abdomen:  soft, gravid Ex:  no cords, erythema SVE:  1/th/-5 FHTs:  140s, good STV, NST R Toco:  q 5-10   A/P   39104w0dth transverse back down lie and funic presentation.  For LTCS.  All R/B/Alt d/w pt including possible T incision or classic to get baby out.  Pt also desires permanent sterilization and d/w her all R/B/Alt of BTL.  Agrees to proceed.    GBS neg.  Deashia Soule A

## 2013-11-11 NOTE — Progress Notes (Signed)
Pt husband out to desk stating that his wife is very upset about having a c/s and that she does not want to proceed.  When entering room pt is very teary and quiet.  C/S and pts concerns discussed.  Pt/spouse asked if it was possible that the baby has already turned or if it would be possible for the MD to turn the baby like they had scheduled last week. Pt would like to wait to proceed with anything until she is able to talk to the MD.

## 2013-11-12 ENCOUNTER — Encounter (HOSPITAL_COMMUNITY): Payer: Self-pay | Admitting: Obstetrics and Gynecology

## 2013-11-12 LAB — CBC
HEMATOCRIT: 27.7 % — AB (ref 36.0–46.0)
HEMOGLOBIN: 9.1 g/dL — AB (ref 12.0–15.0)
MCH: 28.3 pg (ref 26.0–34.0)
MCHC: 32.9 g/dL (ref 30.0–36.0)
MCV: 86 fL (ref 78.0–100.0)
Platelets: 119 10*3/uL — ABNORMAL LOW (ref 150–400)
RBC: 3.22 MIL/uL — AB (ref 3.87–5.11)
RDW: 16 % — ABNORMAL HIGH (ref 11.5–15.5)
WBC: 9.7 10*3/uL (ref 4.0–10.5)

## 2013-11-12 NOTE — Progress Notes (Signed)
POD#1 Pt without complaints. Lochia- wnl VSSAF IMP/ Stable Plan/Routine care

## 2013-11-12 NOTE — Anesthesia Postprocedure Evaluation (Signed)
Anesthesia Post Note  Patient: Kathy Daugherty  Procedure(s) Performed: Procedure(s) (LRB): CESAREAN SECTION (N/A)  Anesthesia type: Spinal  Patient location: Mother/Baby  Post pain: Pain level controlled  Post assessment: Post-op Vital signs reviewed  Last Vitals:  Filed Vitals:   11/12/13 0530  BP: 104/61  Pulse: 94  Temp: 37.1 C  Resp: 18    Post vital signs: Reviewed  Level of consciousness: awake  Complications: No apparent anesthesia complications

## 2013-11-12 NOTE — Addendum Note (Signed)
Addendum created 11/12/13 0800 by Asher Muir, CRNA   Modules edited: Notes Section   Notes Section:  File: [id number]

## 2013-11-12 NOTE — Lactation Note (Signed)
This note was copied from the chart of Kathy Daugherty. Lactation Consultation Note Mom states breastfeeding is going very well. Denies pain, can hear swallows.  Mom has no concerns at this time. Follow up PRN.  Patient Name: Kathy Daugherty ITGPQ'D Date: 11/12/2013 Reason for consult: Follow-up assessment   Maternal Data    Feeding    LATCH Score/Interventions                      Lactation Tools Discussed/Used     Consult Status Consult Status: PRN    Dorise Bullion 11/12/2013, 3:21 PM

## 2013-11-12 NOTE — Lactation Note (Signed)
This note was copied from the chart of Kathy Daugherty. Lactation Consultation Note Experienced BF mom of 1 yr. For 1st child and a little over 2 yrs. For the second one up until 5 months ago, mainly for comfort to sleep. Mom stating didn't have any challenges or difficulties. This baby is latching well w/o pain. Mom encouraged to feed baby 8-12 times/24 hours and with feeding cues. Hand expression taught to Mom. Reviewed Baby & Me book's Breastfeeding Basics. Encouraged to call for assistance if needed and to verify proper latch.[place] brochure given w/resources, support groups and [place] services. Educated about newborn behavior.  Patient Name: Kathy Daugherty [id number] Date: 11/12/2013     Maternal Data    Feeding Feeding Type: Breast Fed Length of feed: 10 min  LATCH Score/Interventions                      Lactation Tools Discussed/Used     Consult Status      Shamus Desantis G 11/12/2013, 6:19 AM

## 2013-11-13 MED ORDER — DOCUSATE SODIUM 100 MG PO CAPS: 100.0000 mg | ORAL_CAPSULE | Freq: Two times a day (BID) | ORAL | Status: DC

## 2013-11-13 MED ORDER — OXYCODONE-ACETAMINOPHEN 5-325 MG PO TABS: 1.0000 | ORAL_TABLET | ORAL | Status: DC | PRN

## 2013-11-13 MED ORDER — IBUPROFEN 600 MG PO TABS: 600.0000 mg | ORAL_TABLET | Freq: Four times a day (QID) | ORAL | Status: DC | PRN

## 2013-11-13 NOTE — Discharge Summary (Signed)
Obstetric Discharge Summary Reason for Admission: onset of labor Prenatal Procedures: NST and ultrasound Intrapartum Procedures: cesarean: low cervical, transverse, bilateral tubal ligation Postpartum Procedures: none Complications-Operative and Postpartum: none Hemoglobin  Date Value Ref Range Status  11/12/2013 9.1* 12.0 - 15.0 g/dL Final     HCT  Date Value Ref Range Status  11/12/2013 27.7* 36.0 - 46.0 % Final    Physical Exam:  General: alert, cooperative and appears stated age 37: appropriate Uterine Fundus: firm Incision: healing well DVT Evaluation: No evidence of DVT seen on physical exam.  Discharge Diagnoses: Term Pregnancy-delivered  Discharge Information: Date: 11/13/2013 Activity: pelvic rest Diet: routine Medications: Ibuprofen, Colace and Percocet Condition: improved Instructions: refer to practice specific booklet Discharge to: home Follow-up Information   Follow up with [name]., MD In 4 weeks. (For a post-partum evaluation)    Specialty:  Obstetrics and Gynecology   Contact information:   [place] [address] [phone]       Newborn Data: Live born female  Birth Weight: 9 lb 0.1 oz (4085 g) APGAR: 8, 8  Home with mother.  Kathy Daugherty H. 11/13/2013, 10:04 AM

## 2013-11-13 NOTE — Lactation Note (Signed)
This note was copied from the chart of Kathy Maize [place]. Lactation Consultation Note  Patient Name: Kathy Daugherty [id number] Date: 11/13/2013  Mother is an experienced with breastfeeding her last baby for 2 years. She sates her baby is feeding well and denies any concerns. She thinks her milk is starting to increase in volume and baby cluster fed last night, Getting ready for discharge. Mother informed of post-discharge support and given phone number to the lactation department, including services for phone call assistance; out-patient appointments; and breastfeeding support group. List of other breastfeeding resources in the community given in the handout. Encouraged mother to call for problems or concerns related to breastfeeding.   Maternal Data    Feeding Feeding Type: Breast Fed Length of feed: 10 min  LATCH Score/Interventions                      Lactation Tools Discussed/Used     Consult Status      Stana Bunting M 11/13/2013, 12:11 PM

## 2014-02-11 ENCOUNTER — Encounter (HOSPITAL_COMMUNITY): Payer: Self-pay | Admitting: Obstetrics and Gynecology

## 2017-01-10 ENCOUNTER — Encounter (HOSPITAL_COMMUNITY): Payer: Self-pay

## 2017-01-20 ENCOUNTER — Other Ambulatory Visit: Payer: Self-pay | Admitting: Obstetrics and Gynecology

## 2017-01-20 DIAGNOSIS — Z01818 Encounter for other preprocedural examination: Secondary | ICD-10-CM

## 2017-01-25 ENCOUNTER — Other Ambulatory Visit: Payer: Self-pay | Admitting: Obstetrics and Gynecology

## 2017-02-01 NOTE — H&P (Signed)
41 y.o. yo complains of menorrhagia. S/p BTL.  Past Medical History:  Diagnosis Date  . AMA (advanced maternal age) multigravida 15+   . Medical history non-contributory   . Sickle cell trait [facility])    Past Surgical History:  Procedure Laterality Date  . CESAREAN SECTION N/Daugherty 11/11/2013   Procedure: CESAREAN SECTION;  Surgeon: Daria Pastures, MD;  Location: [facility];  Service: Obstetrics;  Laterality: N/Daugherty;  . NO PAST SURGERIES      Social History   Social History  . Marital status: Married    Spouse name: N/Daugherty  . Number of children: N/Daugherty  . Years of education: N/Daugherty   Occupational History  . Not on file.   Social History Main Topics  . Smoking status: Never Smoker  . Smokeless tobacco: Never Used  . Alcohol use No  . Drug use: No  . Sexual activity: Yes    Birth control/ protection: None   Other Topics Concern  . Not on file   Social History Narrative  . No narrative on file    No current facility-administered medications on file prior to encounter.    No current outpatient prescriptions on file prior to encounter.    No Known Allergies  @VITALS2 @  Lungs: clear to ascultation Cor:  RRR Abdomen:  soft, nontender, nondistended. Ex:  no cords, erythema Pelvic:     Vulva: no masses, no atrophy, no lesions\ls1   Vagina: no tenderness, no erythema, no abnormal vaginal discharge, no vesicle(s) or ulcers, no cystocele, no rectocele\ls1   Cervix: grossly normal, no discharge, no cervical motion tenderness, sample taken for Daugherty Pap smear\ls1   Uterus: normal size (7), normal shape, midline, no uterine prolapse, non-tender\ls1Bladder/Urethra: normal meatus, no urethral discharge, no urethral mass, bladder non distended, Urethra well supported\ls1   Adnexa/Parametria: no parametrial tenderness, no parametrial mass, no adnexal tenderness, no ovarian mass  pelvic 6x6x5; normal ovaries, no masses or ff. EM 1 cm.  Daugherty:  Menorrhagia and normal [place]- for d&c, hysteroscopy, and  Novasure.   P: All risks, benefits and alternatives d/w patient and she desires to proceed.  Patient will have SCDs during the operation.     Kathy Daugherty

## 2017-02-02 ENCOUNTER — Encounter (HOSPITAL_COMMUNITY)
Admission: AD | Disposition: A | Discharge status: Home / Self Care | Payer: Self-pay | Source: Ambulatory Visit | Attending: Obstetrics and Gynecology

## 2017-02-02 ENCOUNTER — Ambulatory Visit (HOSPITAL_COMMUNITY): Payer: BLUE CROSS/BLUE SHIELD | Admitting: Anesthesiology

## 2017-02-02 ENCOUNTER — Encounter (HOSPITAL_COMMUNITY): Payer: Self-pay

## 2017-02-02 ENCOUNTER — Ambulatory Visit (HOSPITAL_COMMUNITY)
Admission: AD | Admit: 2017-02-02 | Discharge: 2017-02-02 | Disposition: A | Discharge status: Home / Self Care | Payer: BLUE CROSS/BLUE SHIELD | Source: Ambulatory Visit | Attending: Obstetrics and Gynecology | Admitting: Obstetrics and Gynecology

## 2017-02-02 DIAGNOSIS — D573 Sickle-cell trait: Secondary | ICD-10-CM | POA: Diagnosis not present

## 2017-02-02 DIAGNOSIS — Z6841 Body Mass Index (BMI) 40.0 and over, adult: Secondary | ICD-10-CM | POA: Diagnosis not present

## 2017-02-02 DIAGNOSIS — N920 Excessive and frequent menstruation with regular cycle: Secondary | ICD-10-CM | POA: Diagnosis present

## 2017-02-02 HISTORY — PX: DILITATION & CURRETTAGE/HYSTROSCOPY WITH NOVASURE ABLATION: SHX5568

## 2017-02-02 LAB — CBC
HCT: 36.4 % (ref 36.0–46.0)
HEMOGLOBIN: 12.3 g/dL (ref 12.0–15.0)
MCH: 31.1 pg (ref 26.0–34.0)
MCHC: 33.8 g/dL (ref 30.0–36.0)
MCV: 91.9 fL (ref 78.0–100.0)
Platelets: 202 10*3/uL (ref 150–400)
RBC: 3.96 MIL/uL (ref 3.87–5.11)
RDW: 13.3 % (ref 11.5–15.5)
WBC: 8.3 10*3/uL (ref 4.0–10.5)

## 2017-02-02 LAB — PREGNANCY, URINE: PREG TEST UR: NEGATIVE

## 2017-02-02 SURGERY — DILATATION & CURETTAGE/HYSTEROSCOPY WITH NOVASURE ABLATION
Anesthesia: General | Site: Vagina

## 2017-02-02 SURGERY — DILATATION & CURETTAGE/HYSTEROSCOPY WITH NOVASURE ABLATION
Anesthesia: Choice

## 2017-02-02 MED ORDER — PROPOFOL 10 MG/ML IV BOLUS
INTRAVENOUS | Status: AC
  Filled 2017-02-02: qty 20

## 2017-02-02 MED ORDER — DEXAMETHASONE SODIUM PHOSPHATE 10 MG/ML IJ SOLN
INTRAMUSCULAR | Status: DC | PRN
  Administered 2017-02-02: 10 mg via INTRAVENOUS

## 2017-02-02 MED ORDER — FENTANYL CITRATE (PF) 250 MCG/5ML IJ SOLN
INTRAMUSCULAR | Status: AC
  Filled 2017-02-02: qty 5

## 2017-02-02 MED ORDER — OXYCODONE HCL 5 MG PO TABS
ORAL_TABLET | ORAL | Status: AC
  Filled 2017-02-02: qty 1

## 2017-02-02 MED ORDER — MEPERIDINE HCL 25 MG/ML IJ SOLN: 6.2500 mg | INTRAMUSCULAR | Status: DC | PRN

## 2017-02-02 MED ORDER — PROPOFOL 10 MG/ML IV BOLUS
INTRAVENOUS | Status: DC | PRN
  Administered 2017-02-02: 180 mg via INTRAVENOUS

## 2017-02-02 MED ORDER — LIDOCAINE HCL (CARDIAC) 20 MG/ML IV SOLN
INTRAVENOUS | Status: AC
  Filled 2017-02-02: qty 5

## 2017-02-02 MED ORDER — ONDANSETRON HCL 4 MG/2ML IJ SOLN: 4.0000 mg | Freq: Once | INTRAMUSCULAR | Status: DC | PRN

## 2017-02-02 MED ORDER — FENTANYL CITRATE (PF) 100 MCG/2ML IJ SOLN
INTRAMUSCULAR | Status: DC | PRN
  Administered 2017-02-02: 100 ug via INTRAVENOUS

## 2017-02-02 MED ORDER — ONDANSETRON HCL 4 MG/2ML IJ SOLN
INTRAMUSCULAR | Status: AC
  Filled 2017-02-02: qty 2

## 2017-02-02 MED ORDER — ACETAMINOPHEN 160 MG/5ML PO SOLN: 325.0000 mg | ORAL | Status: DC | PRN

## 2017-02-02 MED ORDER — ACETAMINOPHEN 325 MG PO TABS: 325.0000 mg | ORAL_TABLET | ORAL | Status: DC | PRN

## 2017-02-02 MED ORDER — OXYCODONE HCL 5 MG/5ML PO SOLN: 5.0000 mg | Freq: Once | ORAL | Status: AC | PRN

## 2017-02-02 MED ORDER — FENTANYL CITRATE (PF) 100 MCG/2ML IJ SOLN
INTRAMUSCULAR | Status: AC
  Filled 2017-02-02: qty 2

## 2017-02-02 MED ORDER — FENTANYL CITRATE (PF) 100 MCG/2ML IJ SOLN
25.0000 ug | INTRAMUSCULAR | Status: DC | PRN
  Administered 2017-02-02: 50 ug via INTRAVENOUS

## 2017-02-02 MED ORDER — KETOROLAC TROMETHAMINE 30 MG/ML IJ SOLN
INTRAMUSCULAR | Status: AC
  Filled 2017-02-02: qty 1

## 2017-02-02 MED ORDER — KETOROLAC TROMETHAMINE 30 MG/ML IJ SOLN: 30.0000 mg | Freq: Once | INTRAMUSCULAR | Status: DC | PRN

## 2017-02-02 MED ORDER — DEXAMETHASONE SODIUM PHOSPHATE 4 MG/ML IJ SOLN
INTRAMUSCULAR | Status: AC
  Filled 2017-02-02: qty 1

## 2017-02-02 MED ORDER — SCOPOLAMINE 1 MG/3DAYS TD PT72
MEDICATED_PATCH | TRANSDERMAL | Status: AC
  Administered 2017-02-02: 1.5 mg via TRANSDERMAL
  Filled 2017-02-02: qty 1

## 2017-02-02 MED ORDER — ONDANSETRON HCL 4 MG/2ML IJ SOLN
INTRAMUSCULAR | Status: DC | PRN
  Administered 2017-02-02: 4 mg via INTRAVENOUS

## 2017-02-02 MED ORDER — LACTATED RINGERS IV SOLN
INTRAVENOUS | Status: DC
  Administered 2017-02-02: 125 mL/h via INTRAVENOUS

## 2017-02-02 MED ORDER — KETOROLAC TROMETHAMINE 30 MG/ML IJ SOLN
INTRAMUSCULAR | Status: DC | PRN
  Administered 2017-02-02: 30 mg via INTRAVENOUS

## 2017-02-02 MED ORDER — MIDAZOLAM HCL 2 MG/2ML IJ SOLN
INTRAMUSCULAR | Status: AC
  Filled 2017-02-02: qty 2

## 2017-02-02 MED ORDER — SODIUM CHLORIDE 0.9 % IR SOLN
Status: DC | PRN
  Administered 2017-02-02: 3000 mL

## 2017-02-02 MED ORDER — OXYCODONE HCL 5 MG PO TABS
5.0000 mg | ORAL_TABLET | Freq: Once | ORAL | Status: AC | PRN
  Administered 2017-02-02: 5 mg via ORAL

## 2017-02-02 MED ORDER — MIDAZOLAM HCL 2 MG/2ML IJ SOLN
INTRAMUSCULAR | Status: DC | PRN
  Administered 2017-02-02: 2 mg via INTRAVENOUS

## 2017-02-02 MED ORDER — SCOPOLAMINE 1 MG/3DAYS TD PT72
1.0000 | MEDICATED_PATCH | Freq: Once | TRANSDERMAL | Status: DC
  Administered 2017-02-02: 1.5 mg via TRANSDERMAL

## 2017-02-02 SURGICAL SUPPLY — 18 items
ABLATOR ENDOMETRIAL BIPOLAR (ABLATOR) ×2 IMPLANT
BIPOLAR CUTTING LOOP 21FR (ELECTRODE)
CANISTER SUCT 3000ML PPV (MISCELLANEOUS) ×2 IMPLANT
CATH ROBINSON RED A/P 16FR (CATHETERS) ×2 IMPLANT
CLOTH BEACON ORANGE TIMEOUT ST (SAFETY) ×2 IMPLANT
CONTAINER PREFILL 10% NBF 60ML (FORM) ×4 IMPLANT
ELECT REM PT RETURN 9FT ADLT (ELECTROSURGICAL)
ELECTRODE REM PT RTRN 9FT ADLT (ELECTROSURGICAL) IMPLANT
GLOVE BIO SURGEON STRL SZ7 (GLOVE) ×2 IMPLANT
GLOVE BIOGEL PI IND STRL 7.0 (GLOVE) ×1 IMPLANT
GLOVE BIOGEL PI INDICATOR 7.0 (GLOVE) ×1
GOWN STRL REUS W/TWL LRG LVL3 (GOWN DISPOSABLE) ×4 IMPLANT
LOOP CUTTING BIPOLAR 21FR (ELECTRODE) IMPLANT
PACK VAGINAL MINOR WOMEN LF (CUSTOM PROCEDURE TRAY) ×2 IMPLANT
PAD OB MATERNITY 4.3X12.25 (PERSONAL CARE ITEMS) ×2 IMPLANT
TOWEL OR 17X24 6PK STRL BLUE (TOWEL DISPOSABLE) ×4 IMPLANT
TUBING AQUILEX INFLOW (TUBING) ×2 IMPLANT
TUBING AQUILEX OUTFLOW (TUBING) ×2 IMPLANT

## 2017-02-02 NOTE — Discharge Instructions (Signed)
DISCHARGE INSTRUCTIONS: D&C / D&E The following instructions have been prepared to help you care for yourself upon your return home.   Personal hygiene:  Use sanitary pads for vaginal drainage, not tampons.  Shower the day after your procedure.  NO tub baths, pools or Jacuzzis for 2-3 weeks.  Wipe front to back after using the bathroom.  Activity and limitations:  Do NOT drive or operate any equipment for 24 hours. The effects of anesthesia are still present and drowsiness may result.  Do NOT rest in bed all day.  Walking is encouraged.  Walk up and down stairs slowly.  You may resume your normal activity in one to two days or as indicated by your physician.  Sexual activity: NO intercourse for at least 2 weeks after the procedure, or as indicated by your physician.  Diet: Eat a light meal as desired this evening. You may resume your usual diet tomorrow.  Return to work: You may resume your work activities in one to two days or as indicated by your doctor.  What to expect after your surgery: Expect to have vaginal bleeding/discharge for 2-3 days and spotting for up to 10 days. It is not unusual to have soreness for up to 1-2 weeks. You may have a slight burning sensation when you urinate for the first day. Mild cramps may continue for a couple of days. You may have a regular period in 2-6 weeks.  Call your doctor for any of the following:  Excessive vaginal bleeding, saturating and changing one pad every hour.  Inability to urinate 6 hours after discharge from hospital.  Pain not relieved by pain medication.  Fever of 100.4 F or greater.  Unusual vaginal discharge or odor.   Call for an appointment:    Patients signature: ______________________  Nurses signature ________________________  Support person's signature_______________________

## 2017-02-02 NOTE — Op Note (Signed)
02/02/2017  11:07 AM  PATIENT:  Kathy Daugherty  41 y.o. female  PRE-OPERATIVE DIAGNOSIS:  menorrhagia  POST-OPERATIVE DIAGNOSIS:  menorrhagia  PROCEDURE:  Procedure(s): DILATATION & CURETTAGE/HYSTEROSCOPY WITH NOVASURE ABLATION (N/A)  SURGEON:  Surgeon(s) and Role:    * Bobbye Charleston, MD - Primary   ASSISTANTS: none   ANESTHESIA:   general  EBL:  50 mL   LOCAL MEDICATIONS USED:  NONE  SPECIMEN:  Source of Specimen:  endometrial currettings  DISPOSITION OF SPECIMEN:  PATHOLOGY  COUNTS:  YES  TOURNIQUET:  * No tourniquets in log *  DICTATION: .Note written in EPIC  PLAN OF CARE: Admit for overnight observation  PATIENT DISPOSITION:  PACU - hemodynamically stable.   Delay start of Pharmacological VTE agent (>24hrs) due to surgical blood loss or risk of bleeding: not applicable  Findings:  Shaggy endometrium.  Hysteroscopic deficit was 150 cc.  Medications: none.  Complications: none.  After adequate anesthesia was achieved, the patient was prepped and draped in the usual sterile fashion.  The speculum was placed in the vagina and the cervix stabilized with a single-tooth tenaculum.  The cervix was dilated with pratt dilators and the hysteroscope passed inside the endometrial cavity.  The above findings were noted and sharp curettage was then performed and uterine curettings sent to path.  The cavity length was 6.5 cm and the Novasure instrument successfully seated.  The width was 5.2 cm and the CO2 test passed.  The ablation went for 55 sec at at power of 180 watts.  Once the ablation was completed, the Novasure instrument was removed and the hysteroscope passed into the cavity again.  Good contact was seen in all areas .   All instruments were then removed from the uterus and vagina.  The tenaculum site was bleeding while the tenaculum was on so a figure of eight stitch was placed with 2-0 vicryl.  The patient tolerated the procedure well.    Alean Kromer  A

## 2017-02-02 NOTE — Anesthesia Postprocedure Evaluation (Signed)
Anesthesia Post Note  Patient: Kathy Daugherty  Procedure(s) Performed: DILATATION & CURETTAGE/HYSTEROSCOPY WITH NOVASURE ABLATION (N/A Vagina )     Patient location during evaluation: PACU Anesthesia Type: General Level of consciousness: sedated and patient cooperative Pain management: pain level controlled Vital Signs Assessment: post-procedure vital signs reviewed and stable Respiratory status: spontaneous breathing Cardiovascular status: stable Anesthetic complications: no    Last Vitals:  Vitals:   02/02/17 1231 02/02/17 1310  BP:  135/72  Pulse: 87 70  Resp: 17 20  Temp: 37.2 C   SpO2: 99% 100%    Last Pain:  Vitals:   02/02/17 1231  TempSrc: Oral   Pain Goal: Patients Stated Pain Goal: 5 (02/02/17 0946)               Nolon Nations

## 2017-02-02 NOTE — Anesthesia Preprocedure Evaluation (Signed)
Anesthesia Evaluation  Patient identified by MRN, date of birth, ID band Patient awake    Reviewed: Allergy & Precautions, NPO status , Patient's Chart, lab work & pertinent test results  Airway Mallampati: II       Dental no notable dental hx. (+) Teeth Intact   Pulmonary neg pulmonary ROS,    Pulmonary exam normal        Cardiovascular negative cardio ROS Normal cardiovascular exam Rhythm:Regular Rate:Normal     Neuro/Psych negative neurological ROS  negative psych ROS   GI/Hepatic negative GI ROS, Neg liver ROS,   Endo/Other  Morbid obesity  Renal/GU negative Renal ROS  negative genitourinary   Musculoskeletal negative musculoskeletal ROS (+)   Abdominal (+) + obese,   Peds  Hematology negative hematology ROS (+)   Anesthesia Other Findings   Reproductive/Obstetrics negative OB ROS                             Anesthesia Physical Anesthesia Plan  ASA: III  Anesthesia Plan: General   Post-op Pain Management:    Induction: Intravenous  PONV Risk Score and Plan: 4 or greater and Ondansetron, Dexamethasone, Midazolam and Scopolamine patch - Pre-op  Airway Management Planned: LMA  Additional Equipment:   Intra-op Plan:   Post-operative Plan:   Informed Consent: I have reviewed the patients History and Physical, chart, labs and discussed the procedure including the risks, benefits and alternatives for the proposed anesthesia with the patient or authorized representative who has indicated his/her understanding and acceptance.   Dental advisory given  Plan Discussed with: CRNA and Surgeon  Anesthesia Plan Comments:         Anesthesia Quick Evaluation

## 2017-02-02 NOTE — Brief Op Note (Signed)
02/02/2017  11:07 AM  PATIENT:  Kathy Daugherty  41 y.o. female  PRE-OPERATIVE DIAGNOSIS:  menorrhagia  POST-OPERATIVE DIAGNOSIS:  menorrhagia  PROCEDURE:  Procedure(s): DILATATION & CURETTAGE/HYSTEROSCOPY WITH NOVASURE ABLATION (N/Daugherty)  SURGEON:  Surgeon(s) and Role:    * Bobbye Charleston, MD - Primary   ASSISTANTS: none   ANESTHESIA:   general  EBL:  50 mL   LOCAL MEDICATIONS USED:  NONE  SPECIMEN:  Source of Specimen:  endometrial currettings  DISPOSITION OF SPECIMEN:  PATHOLOGY  COUNTS:  YES  TOURNIQUET:  * No tourniquets in log *  DICTATION: .Note written in EPIC  PLAN OF CARE: Admit for overnight observation  PATIENT DISPOSITION:  PACU - hemodynamically stable.   Delay start of Pharmacological VTE agent (>24hrs) due to surgical blood loss or risk of bleeding: not applicable  Findings:  Shaggy endometrium.  Hysteroscopic deficit was 150 cc.  Medications: none.  Complications: none.  After adequate anesthesia was achieved, the patient was prepped and draped in the usual sterile fashion.  The speculum was placed in the vagina and the cervix stabilized with Daugherty single-tooth tenaculum.  The cervix was dilated with pratt dilators and the hysteroscope passed inside the endometrial cavity.  The above findings were noted and sharp curettage was then performed and uterine curettings sent to path.  The cavity length was 6.5 cm and the Novasure instrument successfully seated.  The width was 5.2 cm and the CO2 test passed.  The ablation went for 55 sec at at power of 180 watts.  Once the ablation was completed, the Novasure instrument was removed and the hysteroscope passed into the cavity again.  Good contact was seen in all areas .   All instruments were then removed from the uterus and vagina.  The tenaculum site was bleeding while the tenaculum was on so Daugherty figure of eight stitch was placed with 2-0 vicryl.  The patient tolerated the procedure well.    Kathy Daugherty

## 2017-02-02 NOTE — Anesthesia Procedure Notes (Signed)
Procedure Name: LMA Insertion Date/Time: 02/02/2017 10:44 AM Performed by: Casimer Lanius A Pre-anesthesia Checklist: Patient being monitored, Patient identified, Emergency Drugs available and Suction available Patient Re-evaluated:Patient Re-evaluated prior to induction Oxygen Delivery Method: Circle system utilized Preoxygenation: Pre-oxygenation with 100% oxygen Induction Type: IV induction and Inhalational induction Ventilation: Mask ventilation without difficulty LMA: LMA inserted LMA Size: 4.0 Number of attempts: 1 Dental Injury: Teeth and Oropharynx as per pre-operative assessment

## 2017-02-02 NOTE — Transfer of Care (Signed)
Immediate Anesthesia Transfer of Care Note  Patient: Kathy Daugherty  Procedure(s) Performed: DILATATION & CURETTAGE/HYSTEROSCOPY WITH NOVASURE ABLATION (N/A Vagina )  Patient Location: PACU  Anesthesia Type:General  Level of Consciousness: sedated  Airway & Oxygen Therapy: Patient Spontanous Breathing and Patient connected to nasal cannula oxygen  Post-op Assessment: Report given to RN  Post vital signs: Reviewed and stable  Last Vitals:  Vitals:   02/02/17 0946  BP: 135/87  Pulse: 71  Resp: 16  Temp: 37 C  SpO2: 99%    Last Pain:  Vitals:   02/02/17 0946  TempSrc: Oral      Patients Stated Pain Goal: 5 (73/41/93 7902)  Complications: No apparent anesthesia complications

## 2017-02-02 NOTE — Progress Notes (Signed)
There has been no change in the patients history, status or exam since the history and physical.  There were no vitals filed for this visit.  Lab Results  Component Value Date   WBC 9.7 11/12/2013   HGB 9.1 (L) 11/12/2013   HCT 27.7 (L) 11/12/2013   MCV 86.0 11/12/2013   PLT 119 (L) 11/12/2013     Kathy Daugherty A

## 2017-02-03 ENCOUNTER — Encounter (HOSPITAL_COMMUNITY): Payer: Self-pay | Admitting: Obstetrics and Gynecology

## 2020-09-26 ENCOUNTER — Other Ambulatory Visit (HOSPITAL_COMMUNITY)
Admission: RE | Admit: 2020-09-26 | Discharge: 2020-09-26 | Disposition: A | Discharge status: Home / Self Care | Payer: BC Managed Care – PPO | Source: Ambulatory Visit | Attending: Family Medicine | Admitting: Family Medicine

## 2020-09-26 ENCOUNTER — Encounter: Payer: Self-pay | Admitting: Emergency Medicine

## 2020-09-26 ENCOUNTER — Emergency Department (INDEPENDENT_AMBULATORY_CARE_PROVIDER_SITE_OTHER)
Admission: EM | Admit: 2020-09-26 | Discharge: 2020-09-26 | Disposition: A | Discharge status: Home / Self Care | Payer: BC Managed Care – PPO | Source: Home / Self Care | Attending: Family Medicine | Admitting: Family Medicine

## 2020-09-26 DIAGNOSIS — N3090 Cystitis, unspecified without hematuria: Secondary | ICD-10-CM | POA: Insufficient documentation

## 2020-09-26 DIAGNOSIS — N760 Acute vaginitis: Secondary | ICD-10-CM | POA: Insufficient documentation

## 2020-09-26 DIAGNOSIS — R300 Dysuria: Secondary | ICD-10-CM | POA: Diagnosis present

## 2020-09-26 LAB — POCT URINALYSIS DIP (MANUAL ENTRY)
Bilirubin, UA: NEGATIVE
Glucose, UA: NEGATIVE mg/dL
Ketones, POC UA: NEGATIVE mg/dL
Nitrite, UA: POSITIVE — AB
Protein Ur, POC: NEGATIVE mg/dL
Spec Grav, UA: 1.015 (ref 1.010–1.025)
Urobilinogen, UA: 0.2 E.U./dL
pH, UA: 6 (ref 5.0–8.0)

## 2020-09-26 MED ORDER — NITROFURANTOIN MONOHYD MACRO 100 MG PO CAPS: ORAL_CAPSULE | ORAL | 0 refills | Status: AC

## 2020-09-26 NOTE — ED Provider Notes (Signed)
[facility]    CSN: [id number] Arrival date & time: 09/26/20  1221      History   Chief Complaint Chief Complaint  Patient presents with   Vaginitis    HPI Kathy Daugherty is a 45 y.o. female.   Patient states that she [place] and subsequently developed dysuria and mild external vaginal irritation without vaginal discharge.  She feels well otherwise and denies abdominal/pelvic pain, nausea/vomiting, and fevers, chills, and sweats.  No LMP recorded. Patient has had an ablation.   The history is provided by the patient.  Dysuria Pain quality:  Burning Pain severity:  Mild Onset quality:  Gradual Duration:  1 week Timing:  Constant Progression:  Worsening Chronicity:  New Recent urinary tract infections: no   Relieved by:  None tried Worsened by:  Nothing Ineffective treatments:  None tried Urinary symptoms: no discolored urine, no foul-smelling urine, no frequent urination, no hematuria, no hesitancy and no bladder incontinence   Associated symptoms: no abdominal pain, no fever, no flank pain, no nausea and no vaginal discharge    Past Medical History:  Diagnosis Date   AMA (advanced maternal age) multigravida 35+    Medical history non-contributory    Sickle cell trait ([place])     Patient Active Problem List   Diagnosis Date Noted   Postoperative state 11/11/2013   Normal labor 11/10/2013   Transverse fetal lie, antepartum 11/10/2013    Past Surgical History:  Procedure Laterality Date   CESAREAN SECTION N/A 11/11/2013   Procedure: CESAREAN SECTION;  Surgeon: Daria Pastures, MD;  Location: [facility];  Service: Obstetrics;  Laterality: N/A;   DILITATION & CURRETTAGE/HYSTROSCOPY WITH NOVASURE ABLATION N/A 02/02/2017   Procedure: DILATATION & CURETTAGE/HYSTEROSCOPY WITH NOVASURE ABLATION;  Surgeon: Bobbye Charleston, MD;  Location: [facility];  Service: Gynecology;  Laterality: N/A;   NO PAST SURGERIES      OB History      Gravida  5   Para  4   Term  4   Preterm      AB  1   Living  3      SAB  1   IAB      Ectopic      Multiple      Live Births  3            Home Medications    Prior to Admission medications   Medication Sig Start Date End Date Taking? Authorizing Provider  Multiple Vitamin (MULTIVITAMIN WITH MINERALS) TABS tablet Take 1 tablet by mouth 3 (three) times a week.   Yes [provider]  nitrofurantoin, macrocrystal-monohydrate, (MACROBID) 100 MG capsule Take one cap PO Q12hr with food. 09/26/20  Yes Kandra Nicolas, MD  Norethin Ace-Eth Estrad-FE (TAYTULLA PO) Take 1 tablet by mouth daily.    [provider]    Family History Family History  Problem Relation Age of Onset   Down syndrome Son     Social History Social History   Tobacco Use   Smoking status: Never   Smokeless tobacco: Never  Substance Use Topics   Alcohol use: No   Drug use: No     Allergies   Patient has no known allergies.   Review of Systems Review of Systems  Constitutional:  Negative for activity change, appetite change, chills, diaphoresis, fatigue and fever.  Gastrointestinal:  Negative for abdominal pain and nausea.  Genitourinary:  Positive for dysuria. Negative for flank  pain, frequency, genital sores, hematuria, pelvic pain, urgency, vaginal bleeding and vaginal discharge.  All other systems reviewed and are negative.   Physical Exam Triage Vital Signs ED Triage Vitals  Enc Vitals Group     BP 09/26/20 1326 119/80     Pulse Rate 09/26/20 1326 70     Resp --      Temp --      Temp src --      SpO2 09/26/20 1326 100 %     Weight --      Height --      Head Circumference --      Peak Flow --      Pain Score 09/26/20 1328 3     Pain Loc --      Pain Edu? --      Excl. in [place]? --    No data found.  Updated Vital Signs BP 119/80 (BP Location: Left Arm)   Pulse 70   SpO2 100%   Visual Acuity Right Eye Distance:   Left Eye Distance:    Bilateral Distance:    Right Eye Near:   Left Eye Near:    Bilateral Near:     Physical Exam Nursing notes and Vital Signs reviewed. Appearance:  Patient appears stated age, and in no acute distress.    Eyes:  Pupils are equal, round, and reactive to light and accomodation.  Extraocular movement is intact.  Conjunctivae are not inflamed   Pharynx:  Normal; moist mucous membranes  Neck:  Supple.  No adenopathy Lungs:  Clear to auscultation.  Breath sounds are equal.  Moving air well. Heart:  Regular rate and rhythm without murmurs, rubs, or gallops.  Abdomen:  Nontender without masses or hepatosplenomegaly.  Bowel sounds are present.  No CVA or flank tenderness.  Extremities:  No edema.  Skin:  No rash present.     UC Treatments / Results  Labs (all labs ordered are listed, but only abnormal results are displayed) Labs Reviewed  POCT URINALYSIS DIP (MANUAL ENTRY) - Abnormal; Notable for the following components:      Result Value   Color, UA light yellow (*)    Clarity, UA cloudy (*)    Blood, UA small (*)    Nitrite, UA Positive (*)    Leukocytes, UA Moderate (2+) (*)    All other components within normal limits  URINE CULTURE  CERVICOVAGINAL ANCILLARY ONLY    EKG   Radiology No results found.  Procedures Procedures (including critical care time)  Medications Ordered in UC Medications - No data to display  Initial Impression / Assessment and Plan / UC Course  I have reviewed the triage vital signs and the nursing notes.  Pertinent labs & imaging results that were available during my care of the patient were reviewed by me and considered in my medical decision making (see chart for details).    Urine culture pending.  Begin Macrobid. As a precaution will check a cervicovaginal ancillary also. Followup with Family Doctor if not improved in one week.   Final Clinical Impressions(s) / UC Diagnoses   Final diagnoses:  Vaginitis and vulvovaginitis  Cystitis      Discharge Instructions      Increase fluid intake. May use non-prescription AZO for about two days, if desired, to decrease urinary discomfort.  If symptoms become significantly worse during the night or over the weekend, proceed to the local emergency room.      ED Prescriptions  Medication Sig Dispense Auth. Provider   nitrofurantoin, macrocrystal-monohydrate, (MACROBID) 100 MG capsule Take one cap PO Q12hr with food. 14 capsule Kandra Nicolas, MD         Kandra Nicolas, MD 09/28/20 1501

## 2020-09-26 NOTE — Discharge Instructions (Signed)
Increase fluid intake. May use non-prescription AZO for about two days, if desired, to decrease urinary discomfort.  If symptoms become significantly worse during the night or over the weekend, proceed to the local emergency room.

## 2020-09-26 NOTE — ED Triage Notes (Signed)
Patient went [place] on [place] 10th & [place]th.  Since then patient has been having vaginal irritation, no discharge and some dysuria.  No hematuria.  OTC Tylenol and Ibuprofen.

## 2020-09-29 LAB — CERVICOVAGINAL ANCILLARY ONLY
Bacterial Vaginitis (gardnerella): POSITIVE — AB
Candida Glabrata: NEGATIVE
Candida Vaginitis: NEGATIVE
Chlamydia: NEGATIVE
Comment: NEGATIVE
Comment: NEGATIVE
Comment: NEGATIVE
Comment: NEGATIVE
Comment: NEGATIVE
Comment: NORMAL
Neisseria Gonorrhea: NEGATIVE
Trichomonas: NEGATIVE

## 2020-09-29 LAB — URINE CULTURE
MICRO NUMBER:: 12023132
SPECIMEN QUALITY:: ADEQUATE

## 2020-10-01 ENCOUNTER — Telehealth: Payer: Self-pay | Admitting: Emergency Medicine

## 2020-10-01 MED ORDER — METRONIDAZOLE 500 MG PO TABS: 500.0000 mg | ORAL_TABLET | Freq: Two times a day (BID) | ORAL | 0 refills | Status: AC
